# Patient Record
Sex: Female | Born: 1950 | Race: White | Hispanic: No | Marital: Married | Smoking: Never smoker
Health system: Southern US, Community
[De-identification: ages and names within clinical notes are randomized; demographics above are authoritative.]

## PROBLEM LIST (undated history)

## (undated) DIAGNOSIS — T753XXA Motion sickness, initial encounter: Secondary | ICD-10-CM

## (undated) HISTORY — PX: BREAST EXCISIONAL BIOPSY: SUR124

## (undated) HISTORY — PX: BREAST BIOPSY: SHX20

---

## 1997-10-20 ENCOUNTER — Other Ambulatory Visit: Admission: RE | Admit: 1997-10-20 | Discharge: 1997-10-20 | Payer: Self-pay | Admitting: Gynecology

## 1998-10-24 ENCOUNTER — Other Ambulatory Visit: Admission: RE | Admit: 1998-10-24 | Discharge: 1998-10-24 | Payer: Self-pay | Admitting: Gynecology

## 1999-11-15 ENCOUNTER — Other Ambulatory Visit: Admission: RE | Admit: 1999-11-15 | Discharge: 1999-11-15 | Payer: Self-pay | Admitting: Gynecology

## 1999-12-06 ENCOUNTER — Other Ambulatory Visit: Admission: RE | Admit: 1999-12-06 | Discharge: 1999-12-06 | Payer: Self-pay | Admitting: Radiology

## 2000-11-25 ENCOUNTER — Other Ambulatory Visit: Admission: RE | Admit: 2000-11-25 | Discharge: 2000-11-25 | Payer: Self-pay | Admitting: Gynecology

## 2000-11-28 ENCOUNTER — Other Ambulatory Visit: Admission: RE | Admit: 2000-11-28 | Discharge: 2000-11-28 | Payer: Self-pay | Admitting: Gynecology

## 2001-12-01 ENCOUNTER — Other Ambulatory Visit: Admission: RE | Admit: 2001-12-01 | Discharge: 2001-12-01 | Payer: Self-pay | Admitting: Gynecology

## 2004-11-23 ENCOUNTER — Ambulatory Visit: Payer: Self-pay | Admitting: General Surgery

## 2005-12-06 ENCOUNTER — Ambulatory Visit: Payer: Self-pay | Admitting: General Surgery

## 2007-01-21 ENCOUNTER — Ambulatory Visit: Payer: Self-pay | Admitting: General Surgery

## 2008-01-22 ENCOUNTER — Ambulatory Visit: Payer: Self-pay | Admitting: General Surgery

## 2009-01-24 ENCOUNTER — Ambulatory Visit: Payer: Self-pay | Admitting: General Surgery

## 2011-11-28 ENCOUNTER — Ambulatory Visit: Payer: Self-pay | Admitting: Internal Medicine

## 2014-07-29 ENCOUNTER — Ambulatory Visit: Payer: Self-pay | Admitting: Internal Medicine

## 2014-08-02 ENCOUNTER — Ambulatory Visit: Payer: Self-pay | Admitting: Internal Medicine

## 2014-08-04 ENCOUNTER — Ambulatory Visit: Payer: Self-pay | Admitting: Internal Medicine

## 2014-08-18 ENCOUNTER — Ambulatory Visit: Payer: Self-pay | Admitting: Surgery

## 2014-08-26 ENCOUNTER — Ambulatory Visit: Payer: Self-pay | Admitting: Surgery

## 2014-08-26 HISTORY — PX: BREAST EXCISIONAL BIOPSY: SUR124

## 2014-10-11 LAB — SURGICAL PATHOLOGY

## 2014-10-17 NOTE — Op Note (Signed)
PATIENT NAME:  Sheila FalconerBOSWELL, [name] MR#:  [id number] DATE OF BIRTH:  May 19, 1951  DATE OF PROCEDURE:  08/26/2014  PREOPERATIVE DIAGNOSIS: Right breast mass.   POSTOPERATIVE DIAGNOSIS: Right breast mass.   PROCEDURE: Excision right breast mass.   SURGEON: Adella HareJ. Wilton Keon Benscoter, M.D.   ANESTHESIA: General.   INDICATIONS: This 64 year old female recently had screening mammograms demonstrating a density in the lower inner quadrant of the right breast. Ultrasound demonstrated a 10 x 5 x 5 mm oval nodule at the 5 o'clock position 4 cm from the nipple. Ultrasound-guided biopsy demonstrated complex intraductal proliferative and sclerotic papillary lesion with atypia. Preop x-ray needle localization was recommended and also recommended excision of the mass for further evaluation and treatment.   DESCRIPTION OF PROCEDURE: The patient was placed on the operating table in the supine position under general anesthesia. The dressing was removed from the inferior medial aspect of the right breast exposing the Kopans wire, which entered the breast at approximately 5 o'clock position.  The wire was cut 2 cm from the skin. The breast was prepared with ChloraPrep,  draped in a sterile manner.   A curvilinear incision was made, which was about 1 cm outside of the border of the areola, and carried down through subcutaneous tissues to encounter the wire. Next, a portion of tissue surrounding the thick part of the wire was excised. This portion of tissue was approximately 1.8 x 1.8 x 3 cm in dimension and was submitted for specimen mammogram and routine pathology. The wound was inspected and several small bleeding points were cauterized. The subcutaneous tissues were infiltrated with 0.5% Sensorcaine with epinephrine. Also, additional tissues surrounding cautery artifact was infiltrated as well. Hemostasis was intact. Subcutaneous tissues were closed with 4-0 chromic, and the skin was closed with running 4-0 Monocryl subcuticular  suture and LiquiBand. The patient tolerated surgery satisfactorily and was then prepared for transfer to the recovery room.     ____________________________ Shela CommonsJ. Renda RollsWilton Kimbely Whiteaker, MD jws:tr D: 08/26/2014 11:17:25 ET T: 08/26/2014 11:31:11 ET JOB#: [id number]  cc: Adella HareJ. Wilton Bandon Sherwin, MD, <Dictator> Adella HareWILTON J Keng Jewel MD ELECTRONICALLY SIGNED 08/27/2014 12:24

## 2014-10-17 NOTE — Op Note (Signed)
PATIENT NAME:  [name], Sheila J MR#:  [id number] DATE OF BIRTH:  08/01/1950  DATE OF PROCEDURE:  08/26/2014  PREOPERATIVE DIAGNOSIS: Right breast mass.   POSTOPERATIVE DIAGNOSIS: Right breast mass.   PROCEDURE: Excision right breast mass.   SURGEON: J. Wilton Demarkus Remmel, M.D.   ANESTHESIA: General.   INDICATIONS: This 63-year-old female recently had screening mammograms demonstrating a density in the lower inner quadrant of the right breast. Ultrasound demonstrated a 10 x 5 x 5 mm oval nodule at the 5 o'clock position 4 cm from the nipple. Ultrasound-guided biopsy demonstrated complex intraductal proliferative and sclerotic papillary lesion with atypia. Preop x-ray needle localization was recommended and also recommended excision of the mass for further evaluation and treatment.   DESCRIPTION OF PROCEDURE: The patient was placed on the operating table in the supine position under general anesthesia. The dressing was removed from the inferior medial aspect of the right breast exposing the Kopans wire, which entered the breast at approximately 5 o'clock position.  The wire was cut 2 cm from the skin. The breast was prepared with ChloraPrep,  draped in a sterile manner.   A curvilinear incision was made, which was about 1 cm outside of the border of the areola, and carried down through subcutaneous tissues to encounter the wire. Next, a portion of tissue surrounding the thick part of the wire was excised. This portion of tissue was approximately 1.8 x 1.8 x 3 cm in dimension and was submitted for specimen mammogram and routine pathology. The wound was inspected and several small bleeding points were cauterized. The subcutaneous tissues were infiltrated with 0.5% Sensorcaine with epinephrine. Also, additional tissues surrounding cautery artifact was infiltrated as well. Hemostasis was intact. Subcutaneous tissues were closed with 4-0 chromic, and the skin was closed with running 4-0 Monocryl subcuticular  suture and LiquiBand. The patient tolerated surgery satisfactorily and was then prepared for transfer to the recovery room.     ____________________________ J. Wilton Jalasia Eskridge, MD jws:tr D: 08/26/2014 11:17:25 ET T: 08/26/2014 11:31:11 ET JOB#: [id number]  cc: J. Wilton Azalea Cedar, MD, <Dictator> WILTON J Icess Bertoni MD ELECTRONICALLY SIGNED 08/27/2014 12:24 

## 2015-07-18 IMAGING — US US BREAST*R* LIMITED INC AXILLA
1 series · 5 of 5 positions shown · non-contrast
Comparison: Priors

CLINICAL DATA: Patient recalled from screening for right breast
mass.

EXAM:
DIGITAL DIAGNOSTIC RIGHT MAMMOGRAM
ULTRASOUND RIGHT BREAST

[Series 1: us breast*right* limited inc axilla · 0.10mm/px · 5 of 5 slices shown]
[im 1/5]
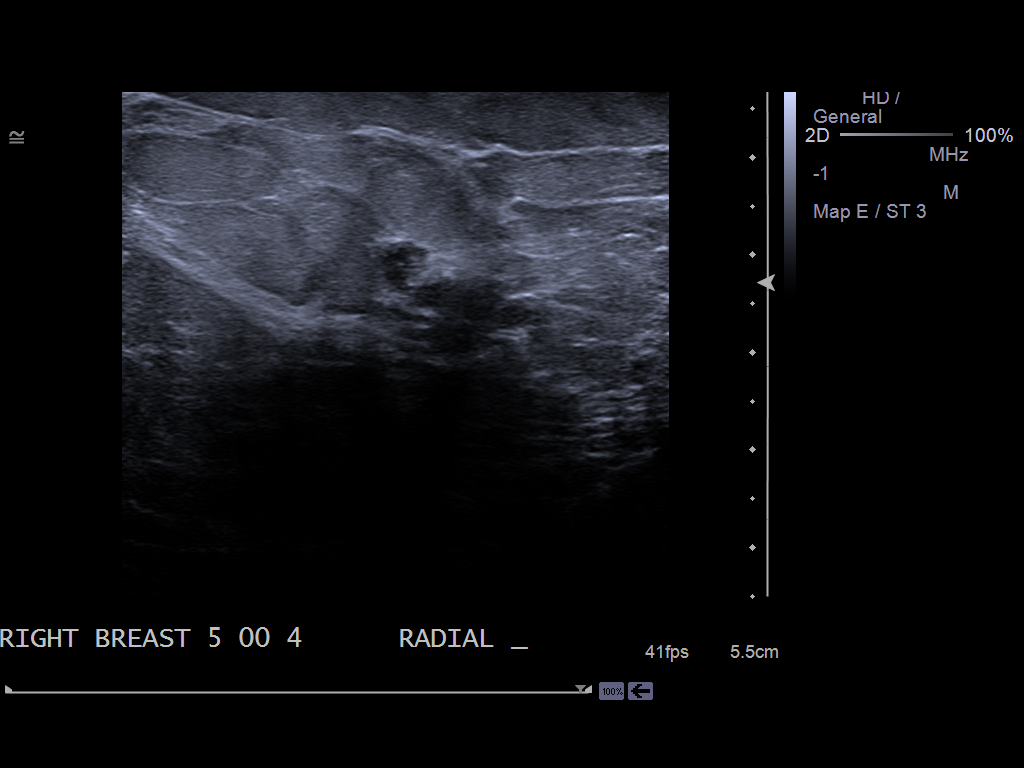
[im 2/5]
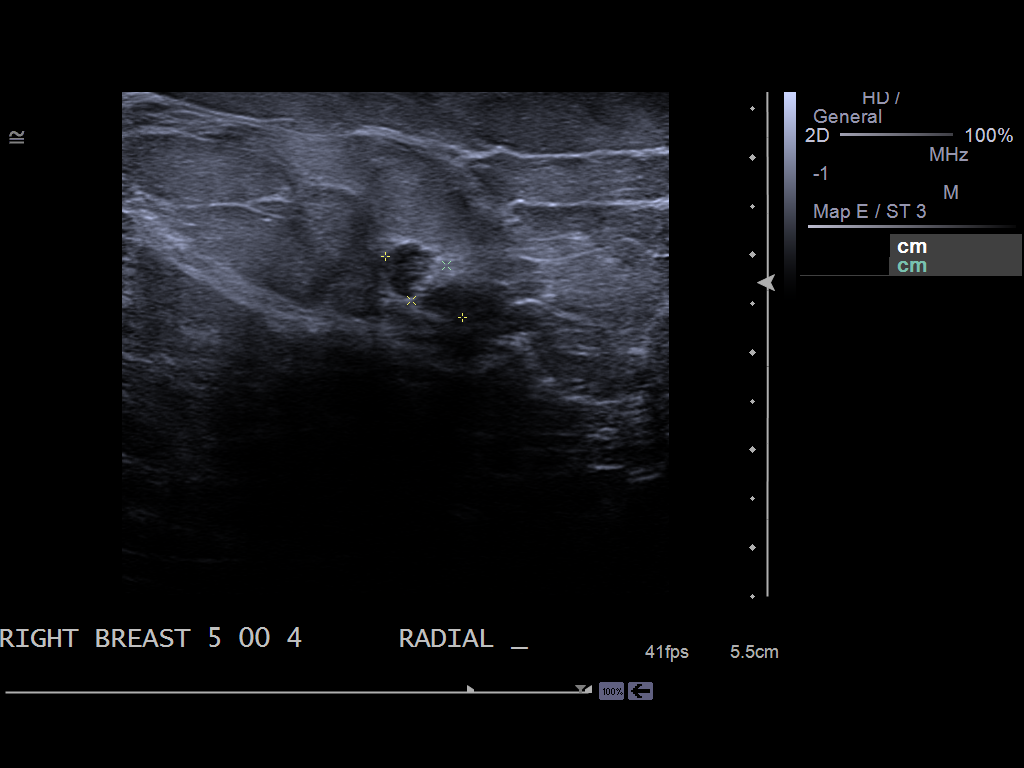
[im 3/5]
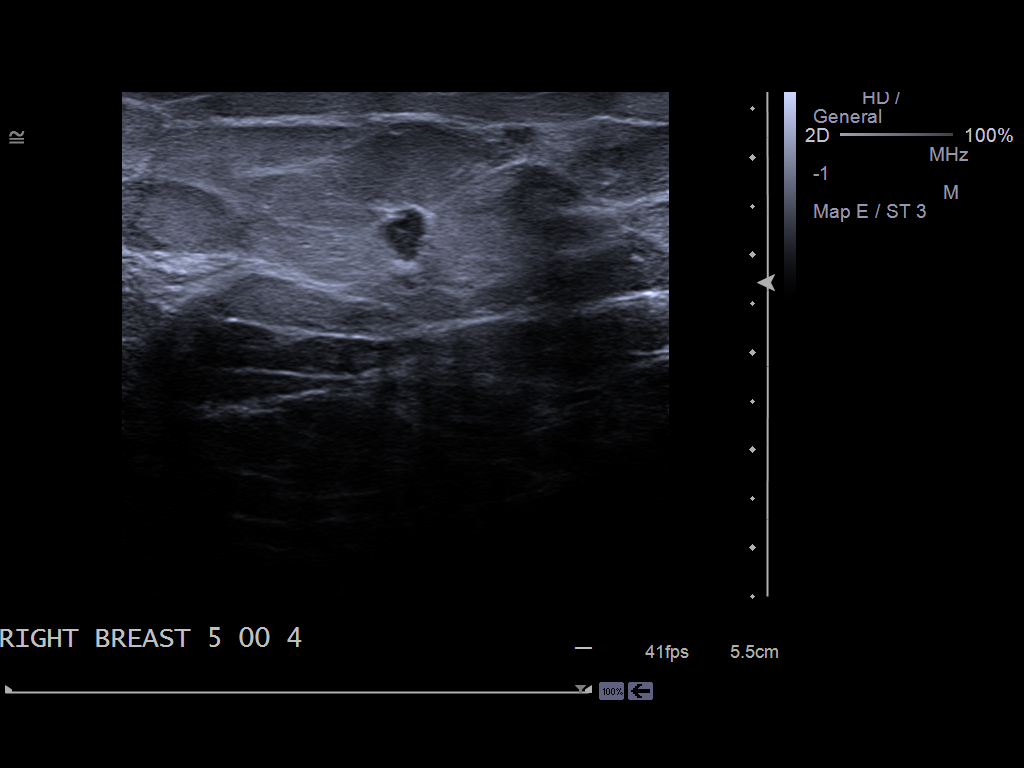
[im 4/5]
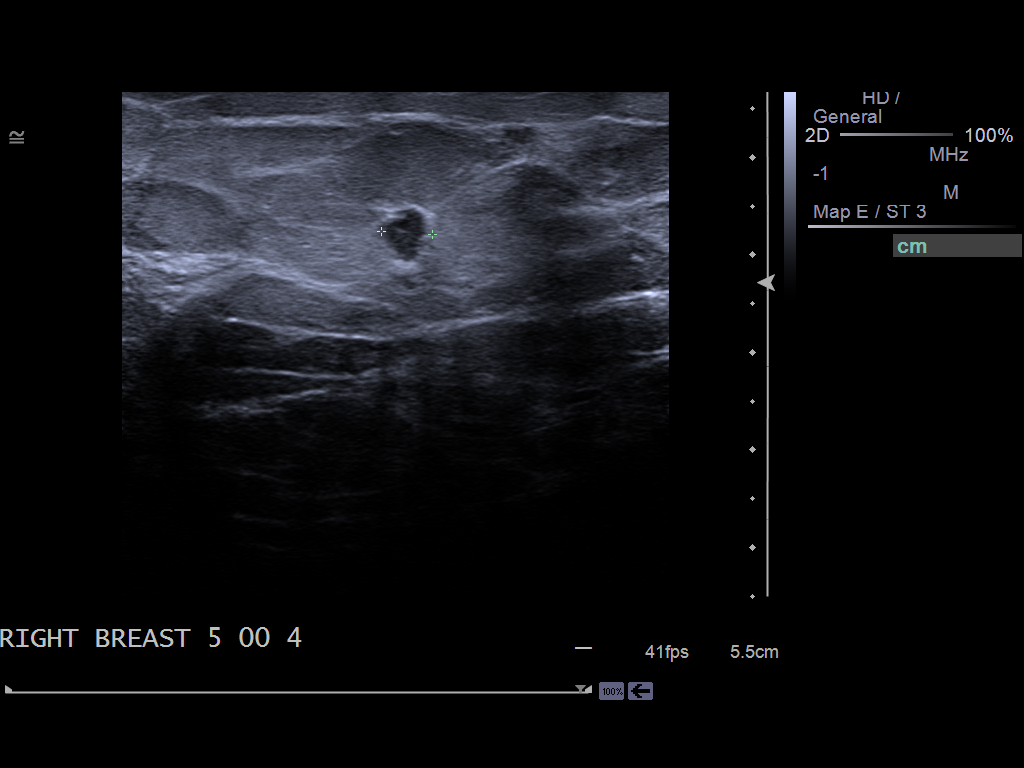
[im 5/5]
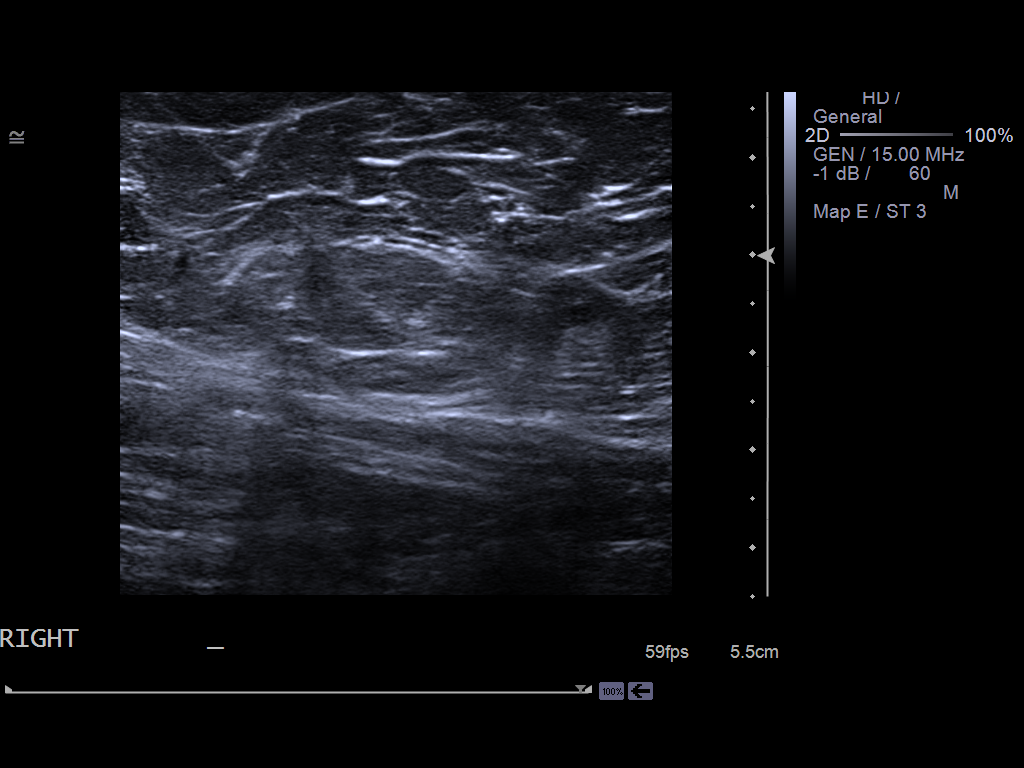

[5 of 5 positions shown; findings below may reference images not displayed]

ACR Breast Density Category b: There are scattered areas of
fibroglandular density.
FINDINGS: Spot compression CC and MLO views demonstrate a new 10 mm oval mass
within the lower inner right breast middle depth.

On physical exam, I palpate no discrete mass within the lower inner
right breast.

Targeted ultrasound is performed, showing a 10 x 5 x 5 mm mildly
irregular hypoechoic mass within the right breast 5 o'clock position
4 cm from the nipple. No right axillary lymphadenopathy.
IMPRESSION: Indeterminate right breast mass.

RECOMMENDATION:
Ultrasound-guided core needle biopsy.

This will be scheduled at the patient's convenience.

I have discussed the findings and recommendations with the patient.
Results were also provided in writing at the conclusion of the
visit. If applicable, a reminder letter will be sent to the patient
regarding the next appointment.

BI-RADS CATEGORY  4: Suspicious.

## 2016-09-21 ENCOUNTER — Other Ambulatory Visit: Payer: Self-pay | Admitting: Internal Medicine

## 2016-09-21 DIAGNOSIS — Z1231 Encounter for screening mammogram for malignant neoplasm of breast: Secondary | ICD-10-CM

## 2016-09-26 ENCOUNTER — Ambulatory Visit
Admission: RE | Admit: 2016-09-26 | Discharge: 2016-09-26 | Disposition: A | Discharge status: Home / Self Care | Payer: Medicare Other | Source: Ambulatory Visit | Attending: Internal Medicine | Admitting: Internal Medicine

## 2016-09-26 ENCOUNTER — Encounter: Payer: Self-pay | Admitting: Radiology

## 2016-09-26 DIAGNOSIS — R928 Other abnormal and inconclusive findings on diagnostic imaging of breast: Secondary | ICD-10-CM | POA: Diagnosis not present

## 2016-09-26 DIAGNOSIS — Z1231 Encounter for screening mammogram for malignant neoplasm of breast: Secondary | ICD-10-CM

## 2016-10-01 ENCOUNTER — Other Ambulatory Visit: Payer: Self-pay | Admitting: Internal Medicine

## 2016-10-01 DIAGNOSIS — R928 Other abnormal and inconclusive findings on diagnostic imaging of breast: Secondary | ICD-10-CM

## 2016-10-11 ENCOUNTER — Ambulatory Visit
Admission: RE | Admit: 2016-10-11 | Discharge: 2016-10-11 | Disposition: A | Discharge status: Home / Self Care | Payer: Medicare Other | Source: Ambulatory Visit | Attending: Internal Medicine | Admitting: Internal Medicine

## 2016-10-11 DIAGNOSIS — R928 Other abnormal and inconclusive findings on diagnostic imaging of breast: Secondary | ICD-10-CM

## 2016-10-11 DIAGNOSIS — N6489 Other specified disorders of breast: Secondary | ICD-10-CM | POA: Insufficient documentation

## 2016-10-12 ENCOUNTER — Other Ambulatory Visit: Payer: Self-pay | Admitting: Internal Medicine

## 2016-10-12 DIAGNOSIS — R928 Other abnormal and inconclusive findings on diagnostic imaging of breast: Secondary | ICD-10-CM

## 2016-10-18 ENCOUNTER — Ambulatory Visit
Admission: RE | Admit: 2016-10-18 | Discharge: 2016-10-18 | Disposition: A | Discharge status: Home / Self Care | Payer: Medicare Other | Source: Ambulatory Visit | Attending: Internal Medicine | Admitting: Internal Medicine

## 2016-10-18 DIAGNOSIS — R928 Other abnormal and inconclusive findings on diagnostic imaging of breast: Secondary | ICD-10-CM

## 2016-10-18 DIAGNOSIS — R921 Mammographic calcification found on diagnostic imaging of breast: Secondary | ICD-10-CM | POA: Diagnosis not present

## 2016-10-18 DIAGNOSIS — N6489 Other specified disorders of breast: Secondary | ICD-10-CM | POA: Diagnosis present

## 2016-10-18 HISTORY — PX: BREAST BIOPSY: SHX20

## 2016-10-19 LAB — SURGICAL PATHOLOGY

## 2018-01-28 ENCOUNTER — Encounter: Payer: Self-pay | Admitting: *Deleted

## 2018-01-28 ENCOUNTER — Other Ambulatory Visit: Payer: Self-pay

## 2018-01-29 ENCOUNTER — Encounter: Payer: Self-pay | Admitting: Anesthesiology

## 2018-01-31 ENCOUNTER — Ambulatory Visit
Admission: RE | Admit: 2018-01-31 | Discharge: 2018-01-31 | Disposition: A | Discharge status: Home / Self Care | Payer: Medicare Other | Source: Ambulatory Visit | Attending: Unknown Physician Specialty | Admitting: Unknown Physician Specialty

## 2018-01-31 ENCOUNTER — Encounter
Admission: RE | Disposition: A | Discharge status: Home / Self Care | Payer: Self-pay | Source: Ambulatory Visit | Attending: Unknown Physician Specialty

## 2018-01-31 DIAGNOSIS — L720 Epidermal cyst: Secondary | ICD-10-CM | POA: Diagnosis not present

## 2018-01-31 DIAGNOSIS — R221 Localized swelling, mass and lump, neck: Secondary | ICD-10-CM | POA: Diagnosis present

## 2018-01-31 HISTORY — PX: EXCISION MASS NECK: SHX6703

## 2018-01-31 HISTORY — DX: Motion sickness, initial encounter: T75.3XXA

## 2018-01-31 SURGERY — EXCISION, MASS, NECK
Anesthesia: LOCAL | Site: Neck | Laterality: Left | Wound class: "Clean "

## 2018-01-31 MED ORDER — LIDOCAINE-EPINEPHRINE 1 %-1:100000 IJ SOLN
INTRAMUSCULAR | Status: DC | PRN
  Administered 2018-01-31: 3 mL via INTRADERMAL

## 2018-01-31 MED ORDER — BACITRACIN 500 UNIT/GM EX OINT
TOPICAL_OINTMENT | CUTANEOUS | Status: DC | PRN
  Administered 2018-01-31: 1 via TOPICAL

## 2018-01-31 SURGICAL SUPPLY — 29 items
"PENCIL ELECTRO HAND CTR " (MISCELLANEOUS) ×1 IMPLANT
BLADE SURG 15 STRL LF DISP TIS (BLADE) ×1 IMPLANT
BLADE SURG 15 STRL SS (BLADE) ×1
BNDG ADH 2 X3.75 FABRIC TAN LF (GAUZE/BANDAGES/DRESSINGS) ×1 IMPLANT
CNTNR SPEC C3OZ STD GRAD LEK (MISCELLANEOUS) ×1 IMPLANT
CONT SPEC 3OZ W/LID STRL (MISCELLANEOUS) ×1
CORD BIP STRL DISP 12FT (MISCELLANEOUS) ×1 IMPLANT
DRAPE HEAD BAR (DRAPES) ×2 IMPLANT
ELECT CAUTERY BLADE TIP 2.5 (TIP) ×2
ELECT REM PT RETURN 9FT ADLT (ELECTROSURGICAL) ×2
ELECTRODE CAUTERY BLDE TIP 2.5 (TIP) ×1 IMPLANT
ELECTRODE REM PT RTRN 9FT ADLT (ELECTROSURGICAL) ×1 IMPLANT
GLOVE BIO SURGEON STRL SZ7.5 (GLOVE) ×3 IMPLANT
GOWN STRL REUS W/ TWL LRG LVL3 (GOWN DISPOSABLE) ×2 IMPLANT
GOWN STRL REUS W/TWL LRG LVL3 (GOWN DISPOSABLE) ×2
KIT TURNOVER KIT A (KITS) ×2 IMPLANT
NDL HYPO 25GX1X1/2 BEV (NEEDLE) ×1 IMPLANT
NEEDLE HYPO 25GX1X1/2 BEV (NEEDLE) ×2 IMPLANT
NS IRRIG 500ML POUR BTL (IV SOLUTION) ×2 IMPLANT
PACK DRAPE NASAL/ENT (PACKS) ×2 IMPLANT
PENCIL ELECTRO HAND CTR (MISCELLANEOUS) ×2 IMPLANT
SOL PREP PVP 2OZ (MISCELLANEOUS) ×2
SOLUTION PREP PVP 2OZ (MISCELLANEOUS) ×1 IMPLANT
SPONGE KITTNER 5P (MISCELLANEOUS) ×2 IMPLANT
SUT PROLENE 4 0 PS 2 18 (SUTURE) ×2 IMPLANT
SUT VIC AB 4-0 RB1 27 (SUTURE) ×1
SUT VIC AB 4-0 RB1 27X BRD (SUTURE) IMPLANT
SYR 10ML LL (SYRINGE) ×2 IMPLANT
TOWEL OR 17X26 4PK STRL BLUE (TOWEL DISPOSABLE) ×2 IMPLANT

## 2018-01-31 NOTE — H&P (Signed)
The patient's history has been reviewed, patient examined, no change in status, stable for surgery.  Questions were answered to the patients satisfaction.  

## 2018-01-31 NOTE — Op Note (Signed)
01/31/2018  10:29 AM    Elio ForgetBoswell, Sheila  [id number]   Pre-Op Dx: NECK MASS  Post-op Dx: SAME  Proc: Excision of left anterior neck mass  Surg:  Davina Pokehapman T Katieann Hungate  Anes:  GOT  EBL: Less than 5 cc  Comp: None  Findings: 2 x 1.5 centimeter superficial neck mass  Procedure: [name] was identified holding area take the operating room placed in supine position.  A left anterior neck mass was easily identifiable and palpable on the left neck.  Incision line was marked using a natural skin crease.  Local anesthetic of 1% lidocaine with 1 100,000 units epinephrine was used to inject around the cystic mass a total of 3 cc was used.  The neck was then prepped and draped in sterile fashion.  A 15 blade was used to make an elliptical incision around the skin paddle overlying the cystic mass.  This was dissected into the subcutaneous fat.  The skin paddle and the underlying cystic mass were excised in entirety using short sharp scissors and the microbipolar.  The mass was thus removed.  Hemostasis was achieved using bipolar cautery.  The subcutaneous tissues were then closed using 4-0 Vicryl the skin was closed using a 4-0 Prolene.  Patient was then taken recovery room in stable condition.  Once on the back table the mass was opened using a 15 blade was consistent with an epidermal inclusion cyst.  Specimen: Left cystic neck mass measuring approximately 2 x 1.5 cm  Dispo:   Good  Plan: Discharged home routine wound care instructions follow-up 10 days  Davina PokeChapman T Angelissa Supan  01/31/2018 10:29 AM

## 2018-01-31 NOTE — OR Nursing (Signed)
0955:  BP:  160/89 RR: 18 SPO2:  100 HR:  73  1000:  BP: 145/88 RR:  18 SPO2:  100 HR:  81  1005:  BP: 144/87 RR :20 SPO2: 100 HR:  86  1010:  BP: 143/90 RR : 20 SPO2: 100 HR  83  1015  BP: 141/85 RR: 18 SPO2: 99 HR: 79  1020:  BP: 136/85 RR: 18 SPO2: 99 HR:78  1025:  BP: 140/96 RR:   18 SPO2:  99 HR:  78

## 2018-02-03 ENCOUNTER — Encounter: Payer: Self-pay | Admitting: Unknown Physician Specialty

## 2018-02-04 LAB — SURGICAL PATHOLOGY

## 2018-03-12 IMAGING — US US BREAST*R* LIMITED INC AXILLA
1 series · 13 of 25 positions shown · non-contrast
Comparison: Previous exam(s).

CLINICAL DATA: 65-year-old female presenting for screening recall
of multiple areas of distortion in the bilateral breasts. The
patient does have history of 2 excisional biopsies in the right
breast, and a stereotactic biopsy in the left breast.

EXAM:
2D DIGITAL DIAGNOSTIC BILATERAL MAMMOGRAM WITH CAD AND ADJUNCT TOMO
BILATERAL BREAST ULTRASOUND

[Series 1: us breast*right* limited inc axilla · 0.06mm/px · 13 of 28 slices shown]
[im 1/28]
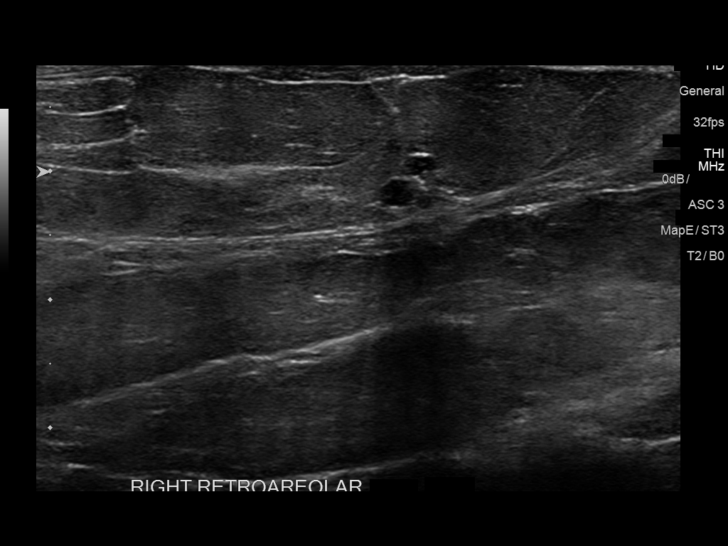
[im 3/28]
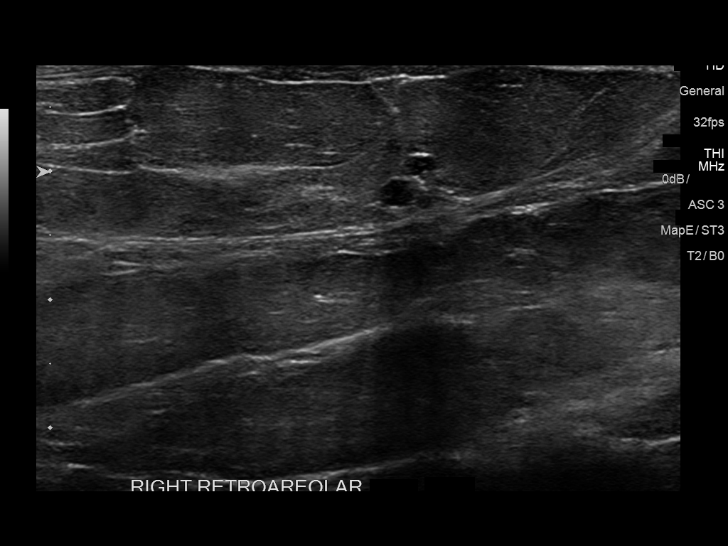
[im 5/28]
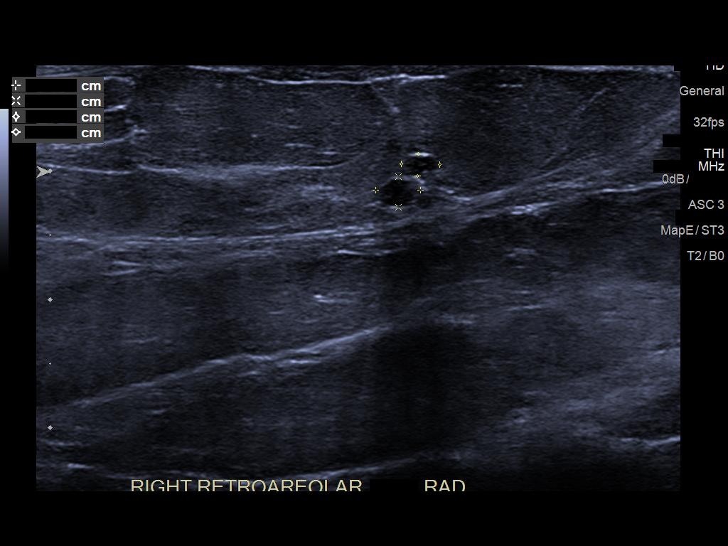
[im 7/28]
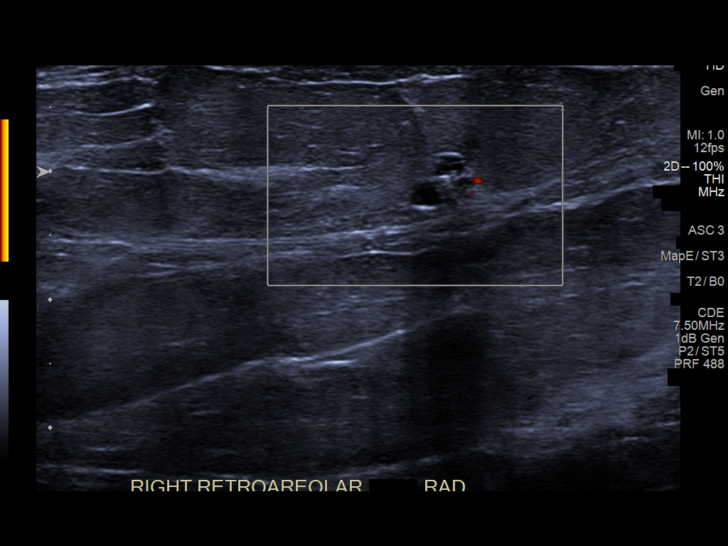
[im 10/28]
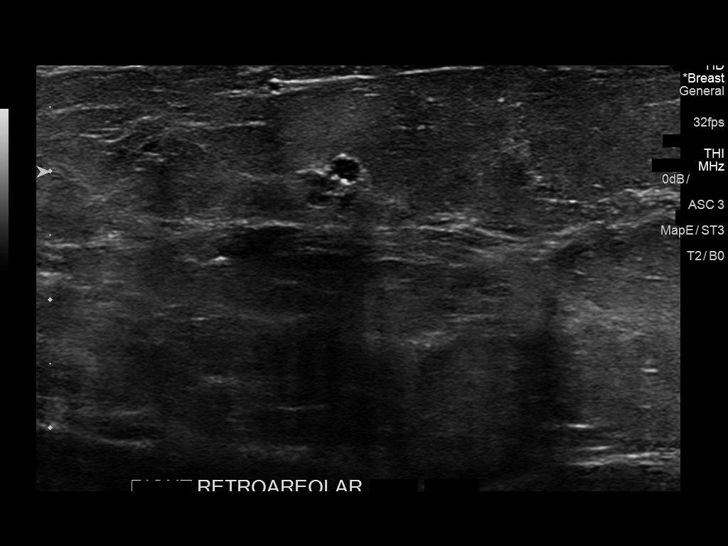
[im 12/28]
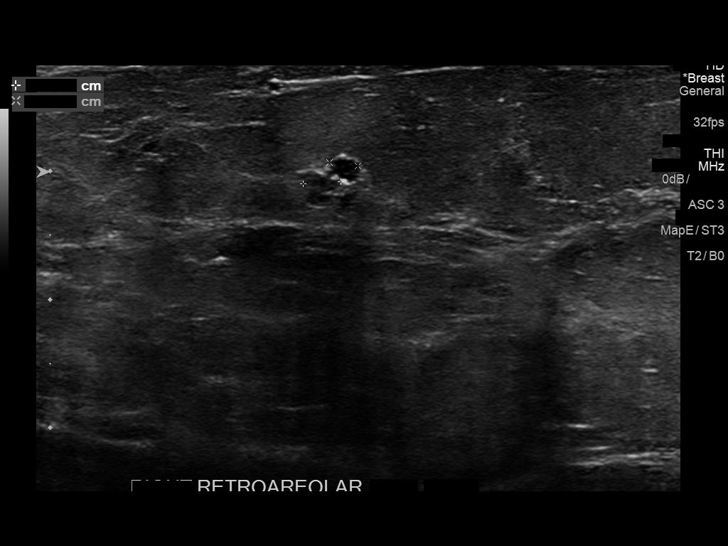
[im 14/28]
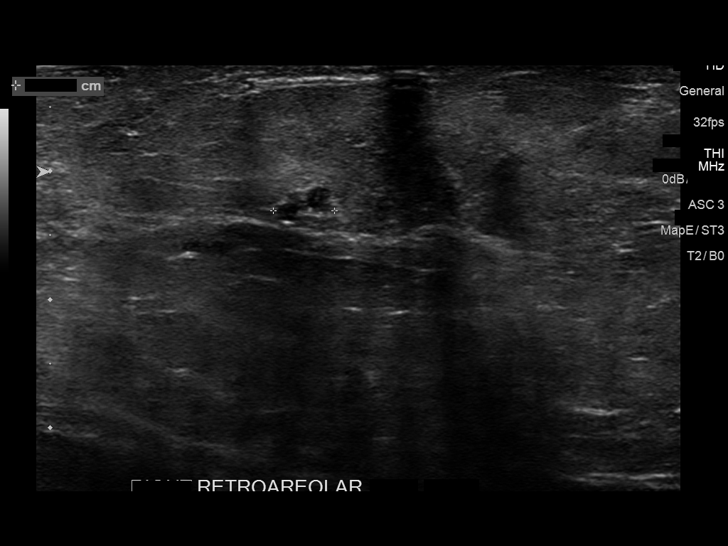
[im 16/28]
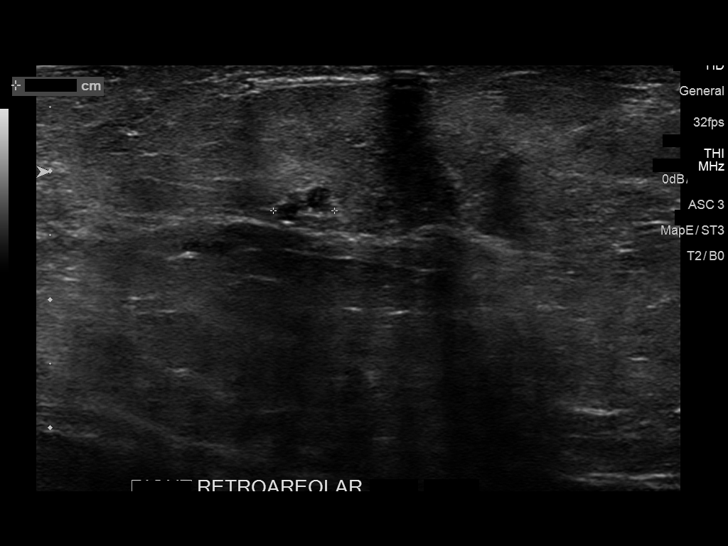
[im 19/28]
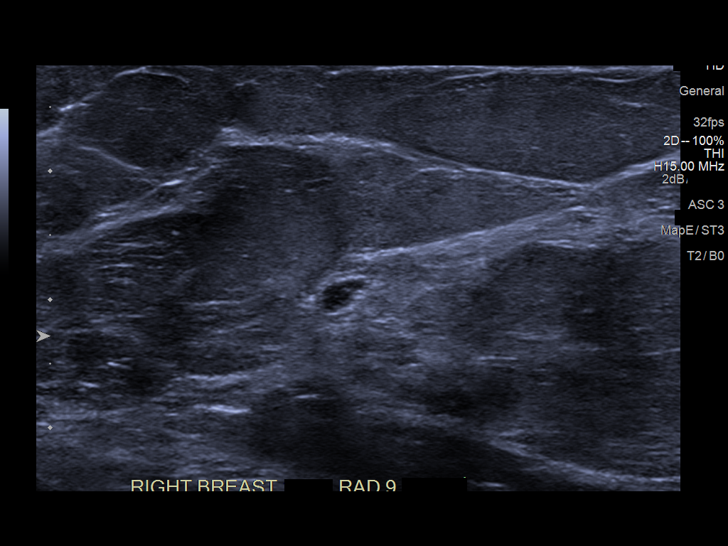
[im 21/28]
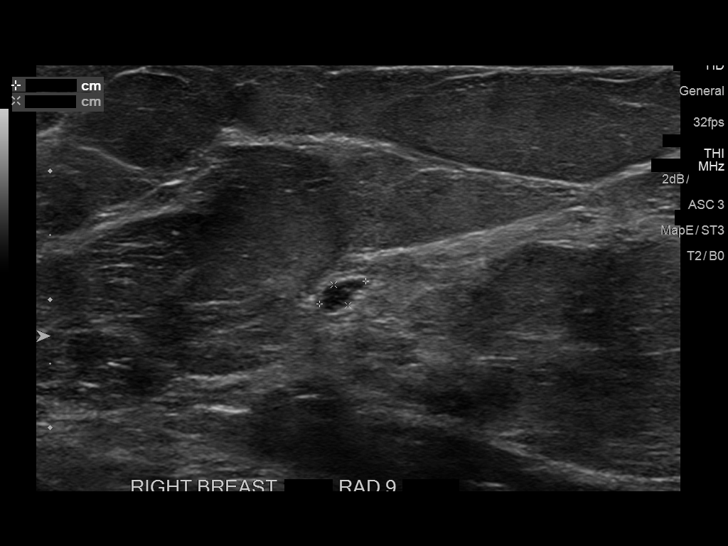
[im 23/28]
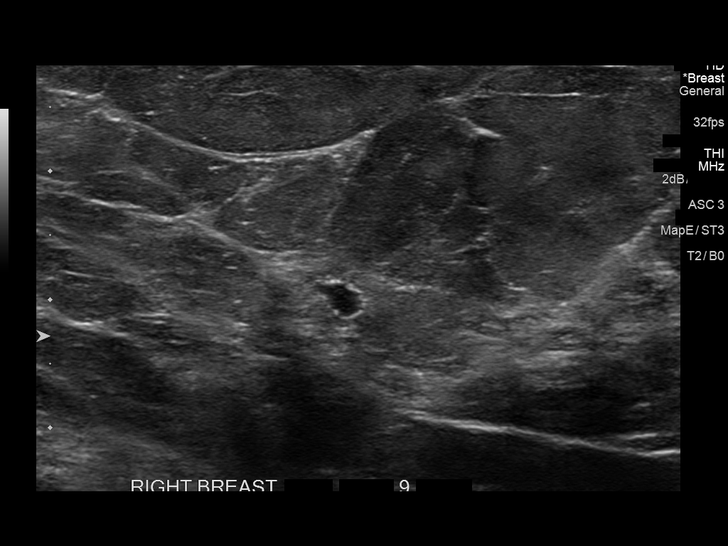
[im 25/28]
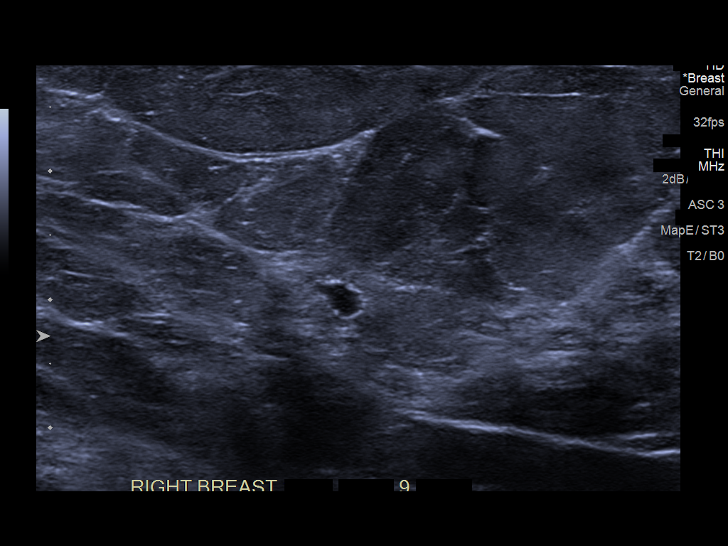
[im 28/28]
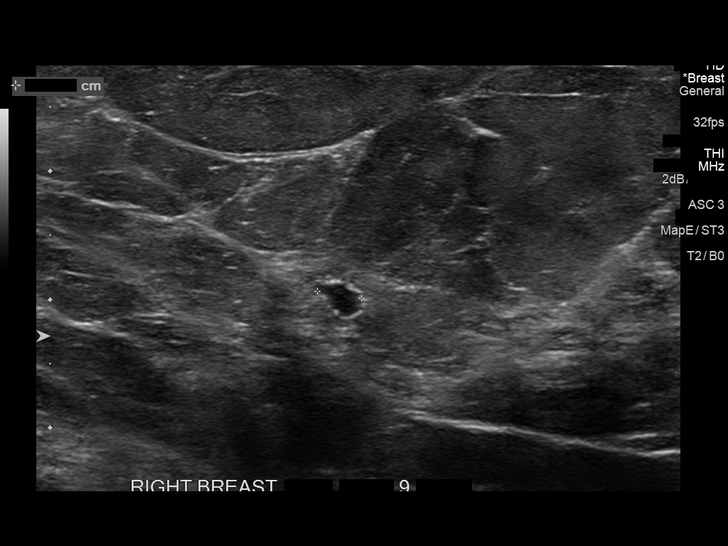

[13 of 25 positions shown; findings below may reference images not displayed]

ACR Breast Density Category b: There are scattered areas of
fibroglandular density.
FINDINGS: There are 3 adjacent areas of distortion in the superior and upper
outer quadrant of the right breast, which altogether span
approximately 8 cm of tissue. Scar markers have been placed on the
breast from prior sites of surgery, however the surgical scars are
in the periareolar and inferior right breast.

There is a similar appearance of more confluent areas of distortion
in the superior and upper outer quadrant of the left breast which
spans approximately 6 cm of tissue. There is a separate more subtle
site of distortion in the inferior slightly lower outer left breast
in the middle depth. The biopsy marking clip from a prior
stereotactic biopsy is identified separate from these areas of
distortion in the lateral aspect of the left breast, anterior depth.

Mammographic images were processed with CAD.

Ultrasound of the upper-outer quadrant of the right breast
demonstrates multiple small cysts. Several small cysts are also seen
in the upper-outer quadrant of the left breast. There are no
suspicious masses in the upper-outer quadrant of either breast to
correspond with the areas of distortion identified mammographically.
IMPRESSION: 1. There are multiple areas of distortion in the breasts
bilaterally, mostly in the upper-outer quadrants. There is no
sonographic correlate for the distortion.

RECOMMENDATION:
Stereotactic biopsy is recommended for 2 distant sites of distortion
in the bilateral breasts. If malignancy is identified on any of
these biopsies, MRI is recommended to evaluate the extent of
disease.

I have discussed the findings and recommendations with the patient.
Results were also provided in writing at the conclusion of the
visit. If applicable, a reminder letter will be sent to the patient
regarding the next appointment.

BI-RADS CATEGORY  4: Suspicious.

## 2018-03-12 IMAGING — US US BREAST*L* LIMITED INC AXILLA
1 series · 13 of 17 positions shown · non-contrast
Comparison: Previous exam(s).

CLINICAL DATA: 65-year-old female presenting for screening recall
of multiple areas of distortion in the bilateral breasts. The
patient does have history of 2 excisional biopsies in the right
breast, and a stereotactic biopsy in the left breast.

EXAM:
2D DIGITAL DIAGNOSTIC BILATERAL MAMMOGRAM WITH CAD AND ADJUNCT TOMO
BILATERAL BREAST ULTRASOUND

[Series 1: us breast*left* limited inc axilla · 0.09mm/px · 13 of 17 slices shown]
[im 1/17]
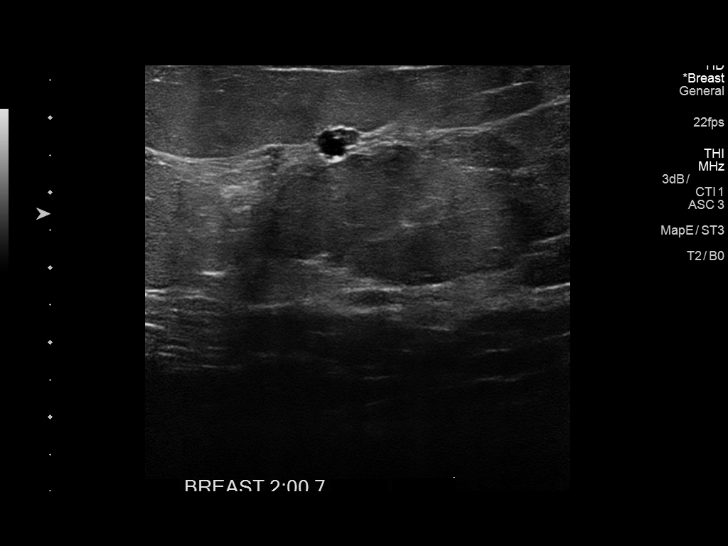
[im 2/17]
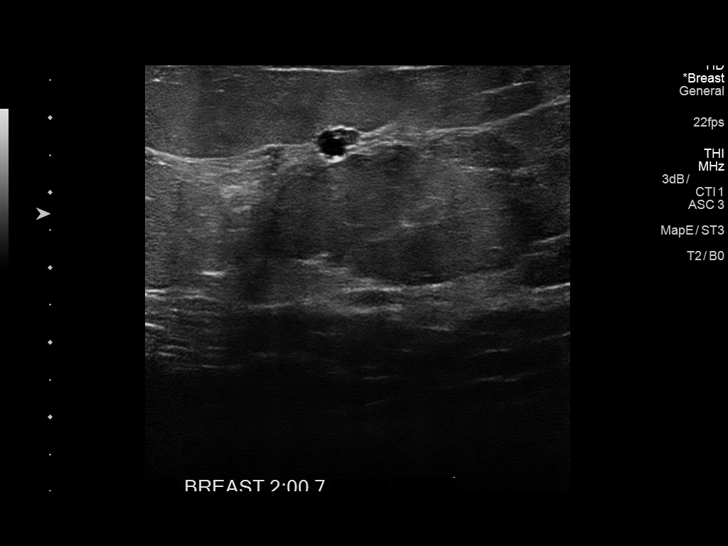
[im 4/17]
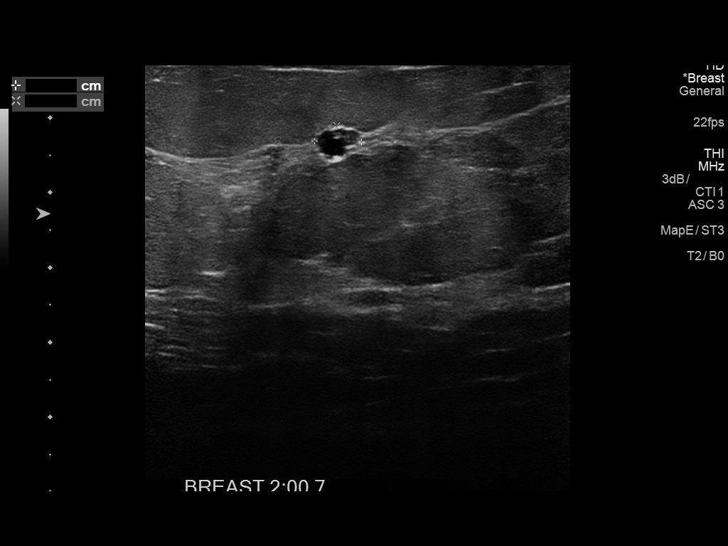
[im 5/17]
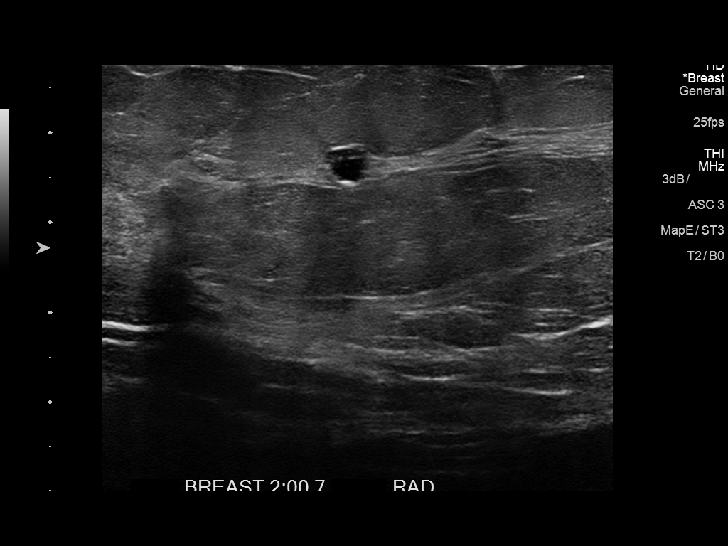
[im 6/17]
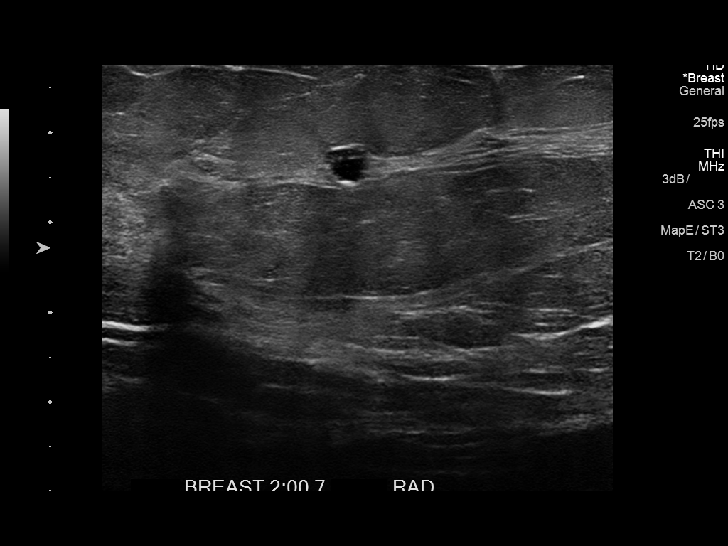
[im 8/17]
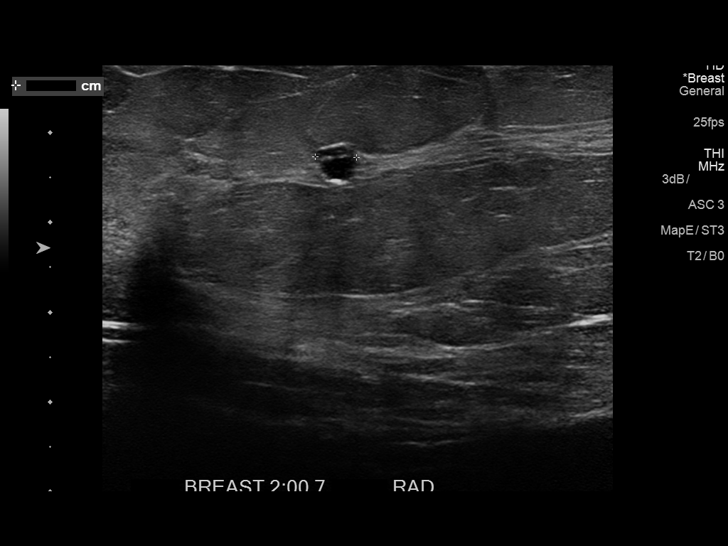
[im 9/17]
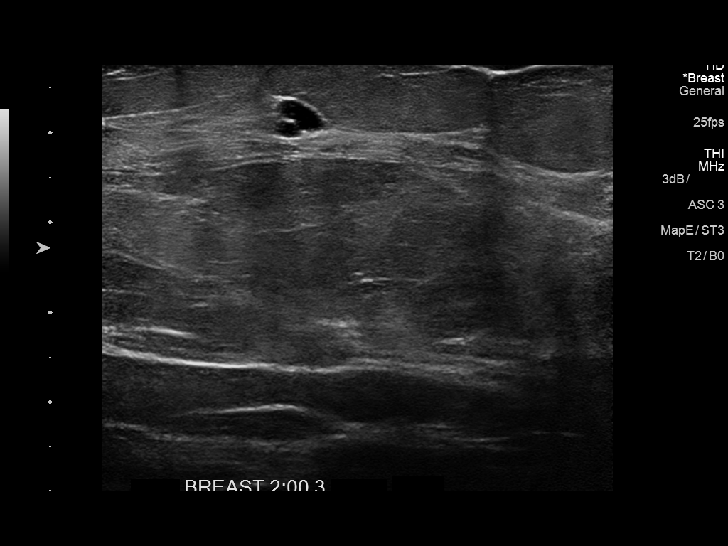
[im 10/17]
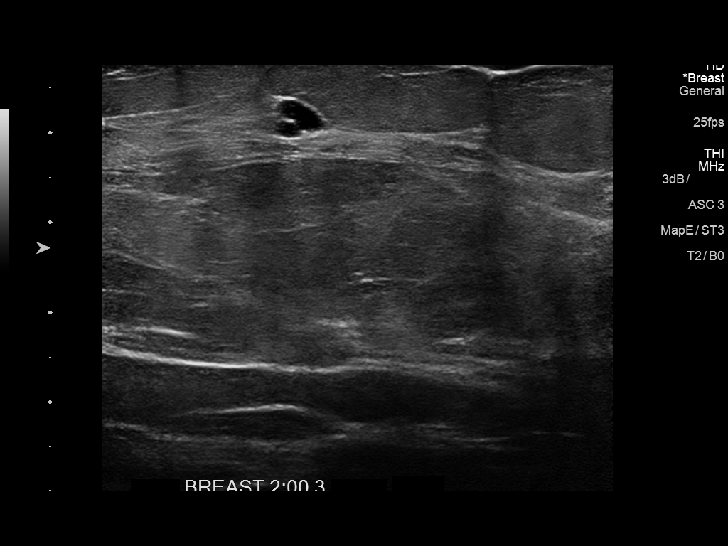
[im 12/17]
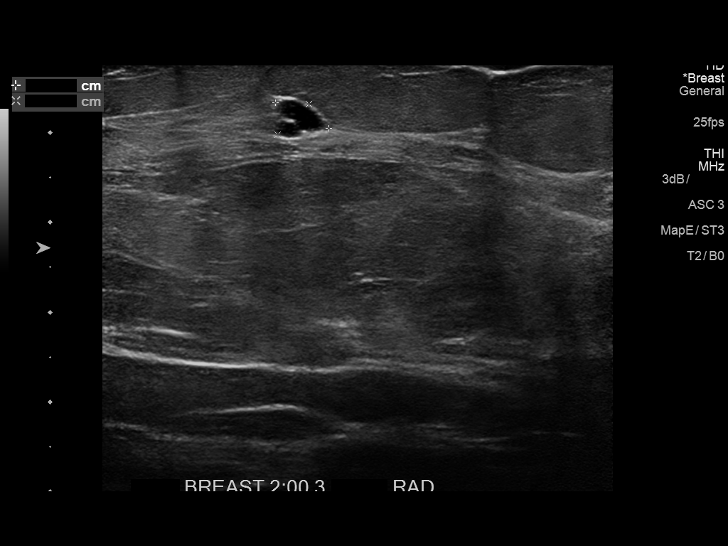
[im 13/17]
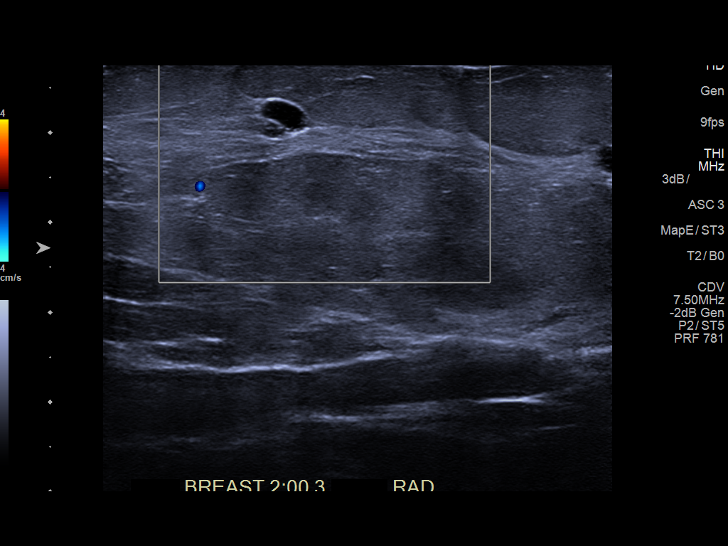
[im 14/17]
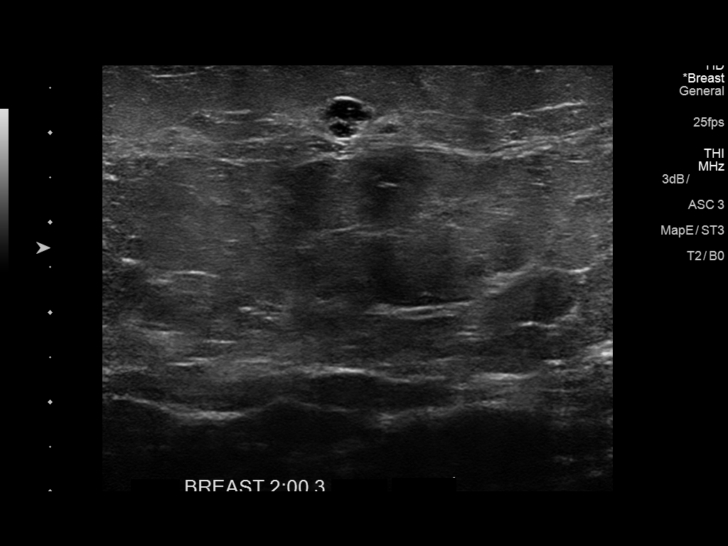
[im 16/17]
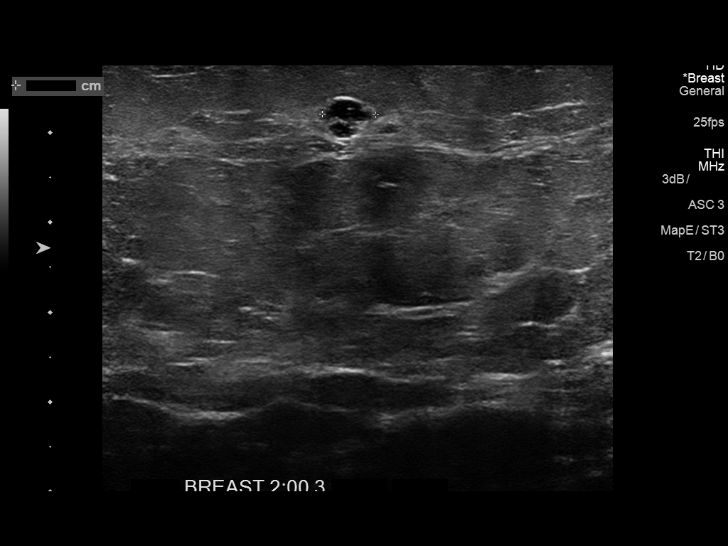
[im 17/17]
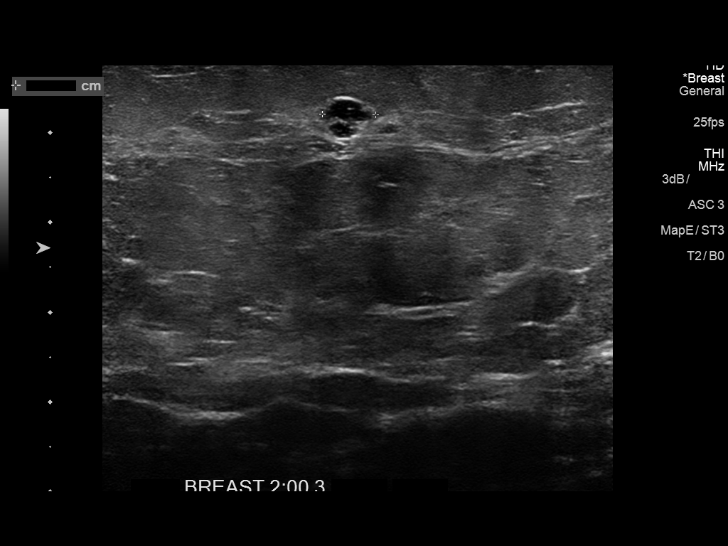

[13 of 17 positions shown; findings below may reference images not displayed]

ACR Breast Density Category b: There are scattered areas of
fibroglandular density.
FINDINGS: There are 3 adjacent areas of distortion in the superior and upper
outer quadrant of the right breast, which altogether span
approximately 8 cm of tissue. Scar markers have been placed on the
breast from prior sites of surgery, however the surgical scars are
in the periareolar and inferior right breast.

There is a similar appearance of more confluent areas of distortion
in the superior and upper outer quadrant of the left breast which
spans approximately 6 cm of tissue. There is a separate more subtle
site of distortion in the inferior slightly lower outer left breast
in the middle depth. The biopsy marking clip from a prior
stereotactic biopsy is identified separate from these areas of
distortion in the lateral aspect of the left breast, anterior depth.

Mammographic images were processed with CAD.

Ultrasound of the upper-outer quadrant of the right breast
demonstrates multiple small cysts. Several small cysts are also seen
in the upper-outer quadrant of the left breast. There are no
suspicious masses in the upper-outer quadrant of either breast to
correspond with the areas of distortion identified mammographically.
IMPRESSION: 1. There are multiple areas of distortion in the breasts
bilaterally, mostly in the upper-outer quadrants. There is no
sonographic correlate for the distortion.

RECOMMENDATION:
Stereotactic biopsy is recommended for 2 distant sites of distortion
in the bilateral breasts. If malignancy is identified on any of
these biopsies, MRI is recommended to evaluate the extent of
disease.

I have discussed the findings and recommendations with the patient.
Results were also provided in writing at the conclusion of the
visit. If applicable, a reminder letter will be sent to the patient
regarding the next appointment.

BI-RADS CATEGORY  4: Suspicious.

## 2020-06-21 ENCOUNTER — Other Ambulatory Visit: Payer: Self-pay | Admitting: Internal Medicine

## 2020-06-21 DIAGNOSIS — Z8249 Family history of ischemic heart disease and other diseases of the circulatory system: Secondary | ICD-10-CM

## 2020-06-27 ENCOUNTER — Other Ambulatory Visit: Payer: Self-pay | Admitting: Internal Medicine

## 2020-06-27 DIAGNOSIS — I10 Essential (primary) hypertension: Secondary | ICD-10-CM

## 2020-06-27 DIAGNOSIS — Z8249 Family history of ischemic heart disease and other diseases of the circulatory system: Secondary | ICD-10-CM

## 2020-06-28 ENCOUNTER — Ambulatory Visit
Admission: RE | Admit: 2020-06-28 | Discharge: 2020-06-28 | Disposition: A | Discharge status: Home / Self Care | Payer: Medicare PPO | Source: Ambulatory Visit | Attending: Internal Medicine | Admitting: Internal Medicine

## 2020-06-28 ENCOUNTER — Other Ambulatory Visit: Payer: Self-pay

## 2020-06-28 DIAGNOSIS — Z8249 Family history of ischemic heart disease and other diseases of the circulatory system: Secondary | ICD-10-CM | POA: Insufficient documentation

## 2020-06-28 DIAGNOSIS — Z136 Encounter for screening for cardiovascular disorders: Secondary | ICD-10-CM | POA: Insufficient documentation

## 2020-06-28 DIAGNOSIS — I10 Essential (primary) hypertension: Secondary | ICD-10-CM | POA: Insufficient documentation

## 2020-06-28 DIAGNOSIS — I700 Atherosclerosis of aorta: Secondary | ICD-10-CM | POA: Insufficient documentation

## 2021-06-13 IMAGING — US US ABDOMINAL AORTA SCREENING AAA
1 series · 14 of 23 positions shown · non-contrast
Comparison: None.

CLINICAL DATA: Patient between 65-75 years of age with family
history of AAA.

EXAM:
US ABDOMINAL AORTA MEDICARE SCREENING
TECHNIQUE: Ultrasound examination of the abdominal aorta was performed as a
screening evaluation for abdominal aortic aneurysm.

[Series 1: us aorta medicare screening · 14 of 23 slices shown]
[im 1/23]
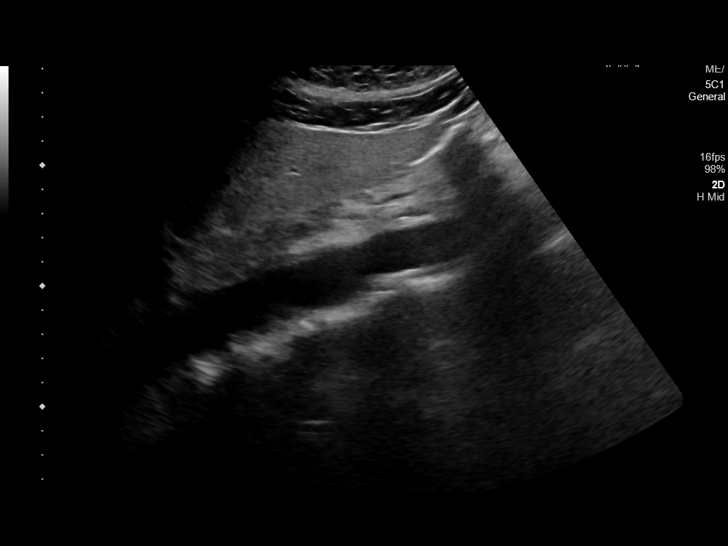
[im 3/23]
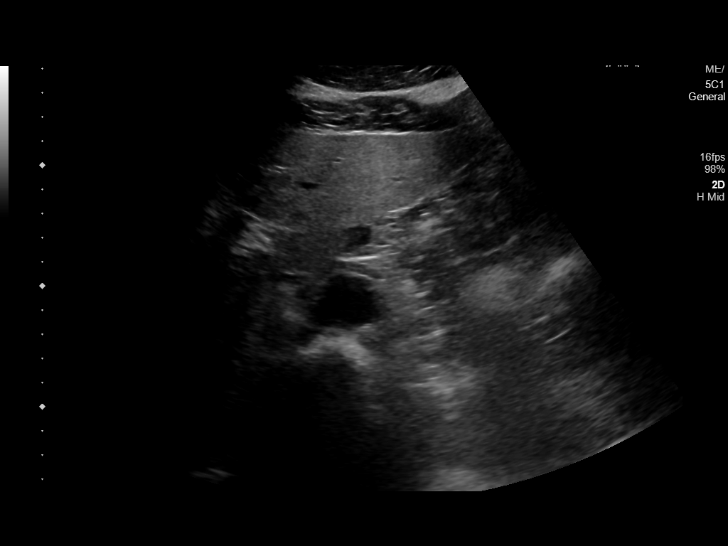
[im 5/23]
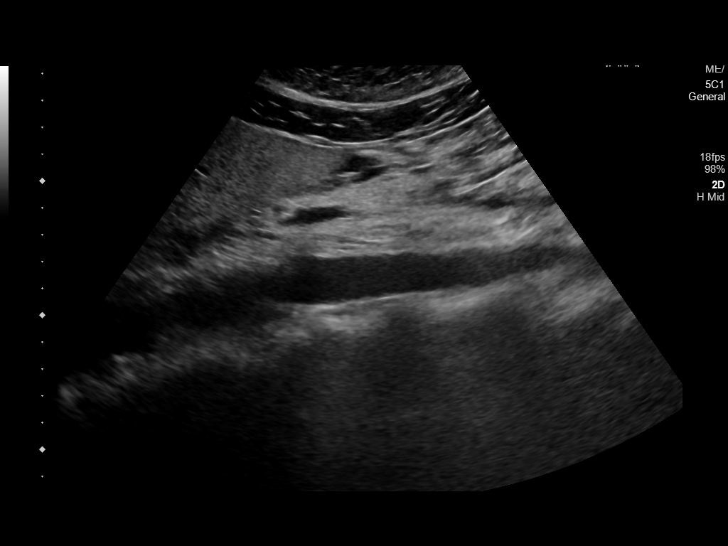
[im 6/23]
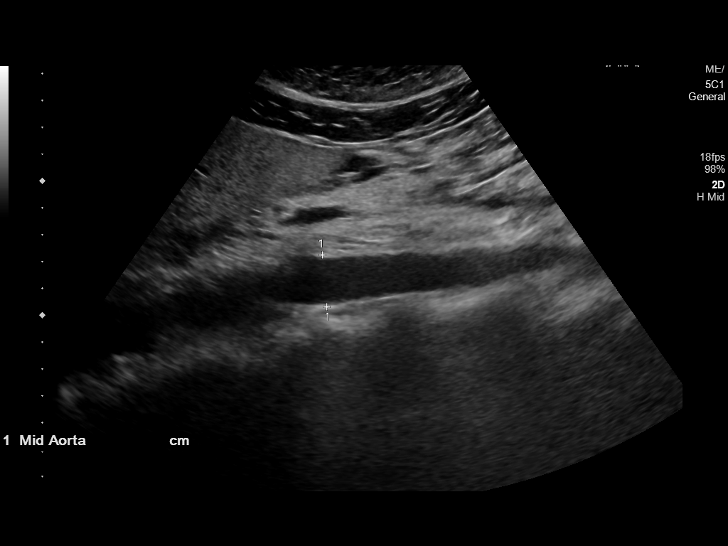
[im 8/23]
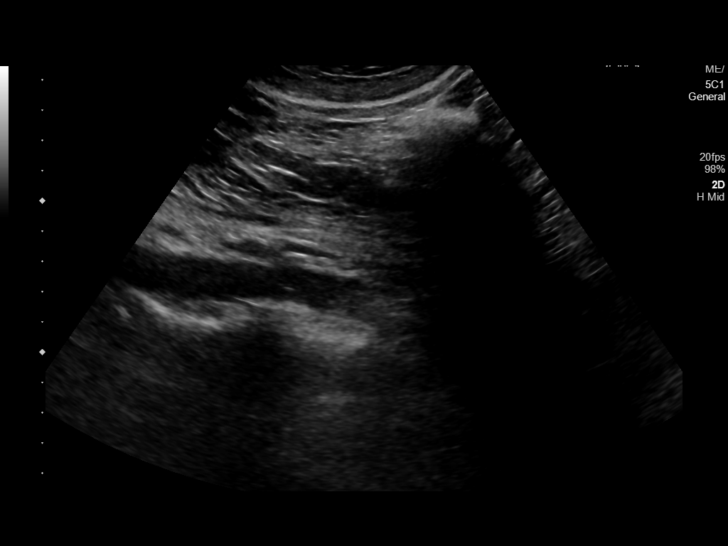
[im 10/23]
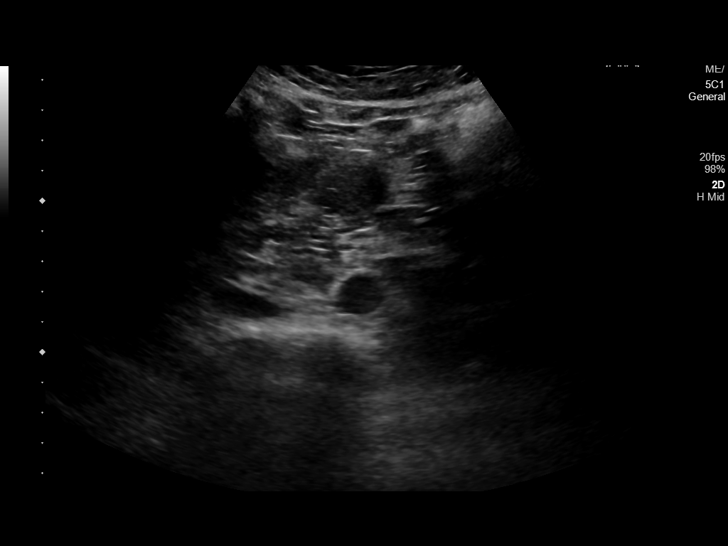
[im 11/23]
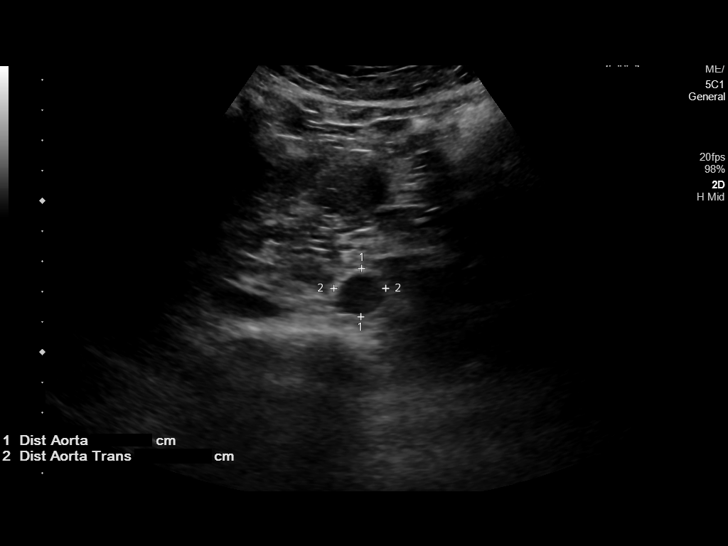
[im 13/23]
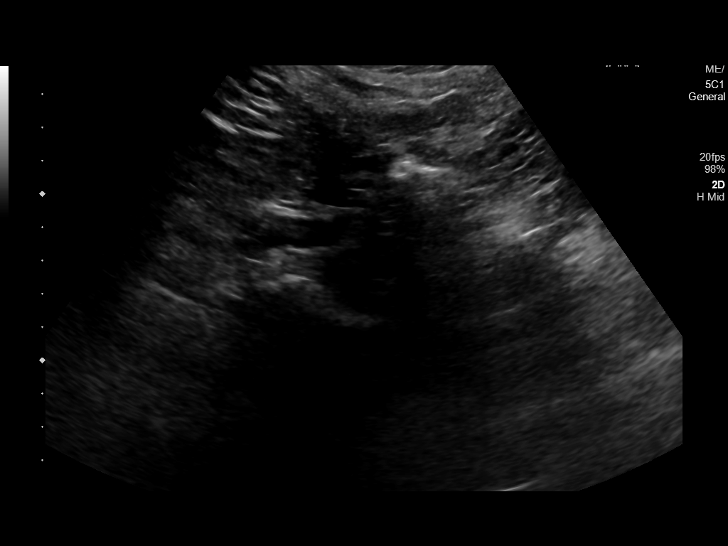
[im 14/23]
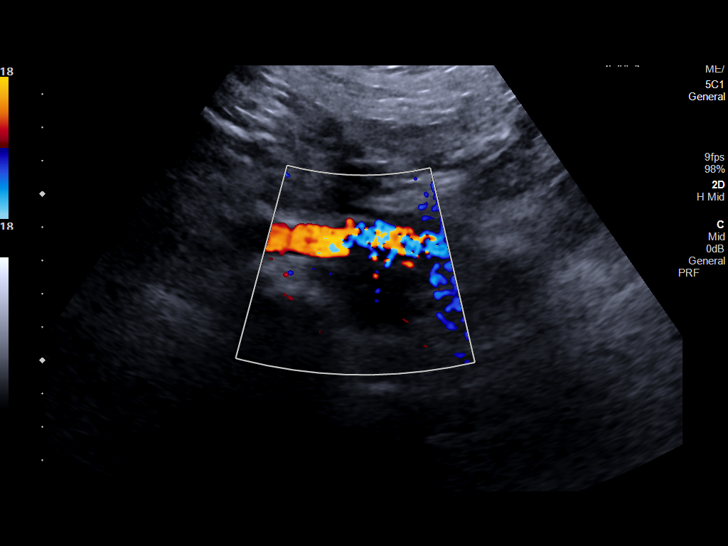
[im 16/23]
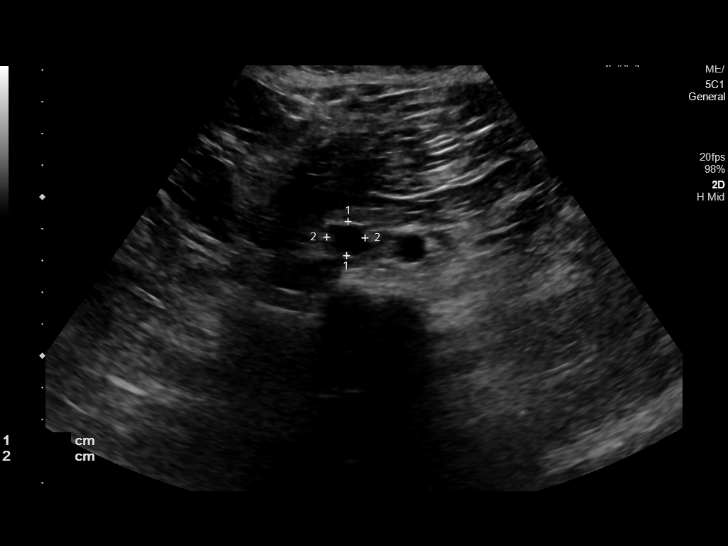
[im 18/23]
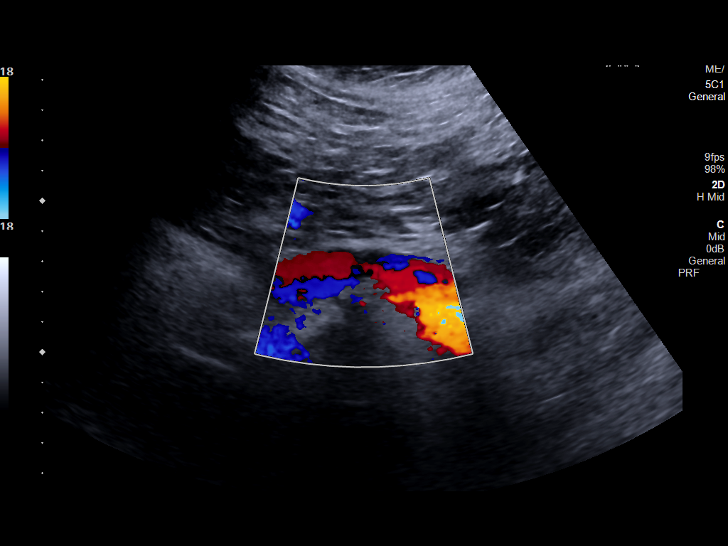
[im 19/23]
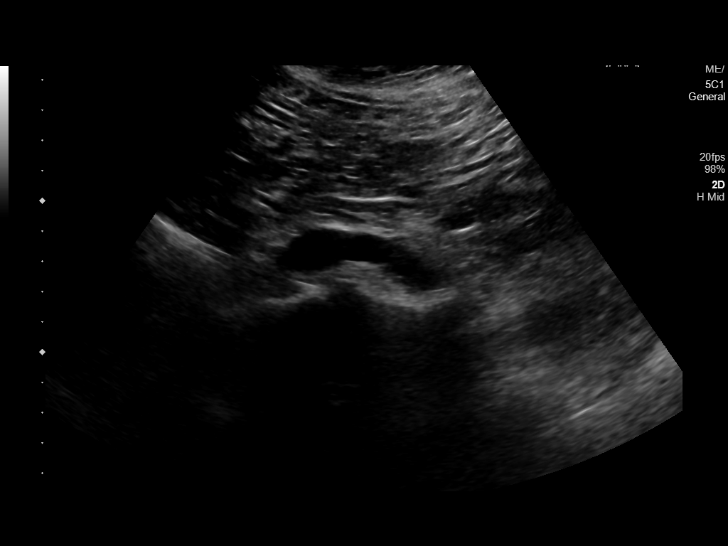
[im 21/23]
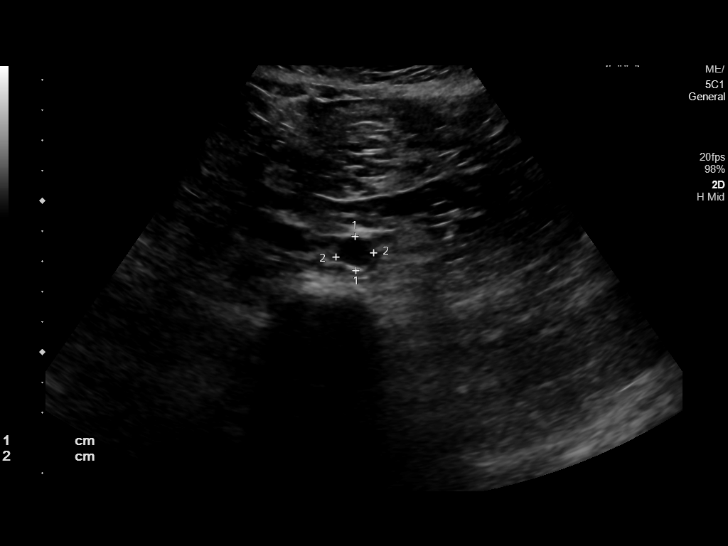
[im 23/23]
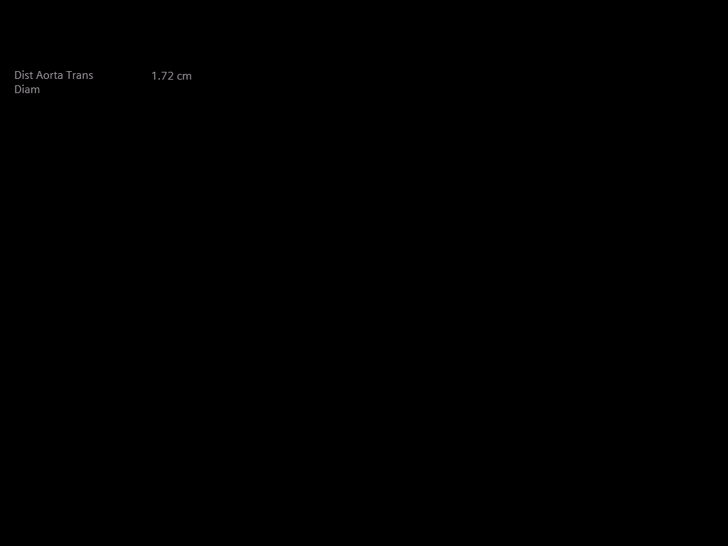

[14 of 23 positions shown; findings below may reference images not displayed]

FINDINGS: Abdominal aortic measurements as follows:

Proximal:  2.8 x 2.4 cm

Mid:  1.7 x 1.7 cm

Distal:  1.7 x 1.6 cm

Right common iliac artery: 1.2 x 1.1 cm

Left common iliac artery: 1.3 x 1.1 cm

Scattered eccentric echogenic plaque is seen throughout the
abdominal aorta.
IMPRESSION: 1. No evidence of abdominal aortic aneurysm.
2.  Aortic Atherosclerosis ([id number]-FVI.I).

## 2024-01-13 ENCOUNTER — Other Ambulatory Visit: Payer: Self-pay | Admitting: Internal Medicine

## 2024-01-13 DIAGNOSIS — E782 Mixed hyperlipidemia: Secondary | ICD-10-CM

## 2024-01-13 DIAGNOSIS — Z0000 Encounter for general adult medical examination without abnormal findings: Secondary | ICD-10-CM

## 2024-01-24 ENCOUNTER — Ambulatory Visit
Admission: RE | Admit: 2024-01-24 | Discharge: 2024-01-24 | Disposition: A | Discharge status: Home / Self Care | Payer: Self-pay | Source: Ambulatory Visit | Attending: Internal Medicine | Admitting: Internal Medicine

## 2024-01-24 DIAGNOSIS — Z0000 Encounter for general adult medical examination without abnormal findings: Secondary | ICD-10-CM | POA: Insufficient documentation

## 2024-01-24 DIAGNOSIS — E782 Mixed hyperlipidemia: Secondary | ICD-10-CM | POA: Insufficient documentation

## 2024-05-11 ENCOUNTER — Ambulatory Visit: Admitting: Anesthesiology

## 2024-05-11 ENCOUNTER — Ambulatory Visit
Admission: RE | Admit: 2024-05-11 | Discharge: 2024-05-11 | Disposition: A | Discharge status: Home Health | Attending: Gastroenterology | Admitting: Gastroenterology

## 2024-05-11 ENCOUNTER — Encounter: Payer: Self-pay | Admitting: Gastroenterology

## 2024-05-11 ENCOUNTER — Encounter
Admission: RE | Disposition: A | Discharge status: Home Health | Payer: Self-pay | Source: Home / Self Care | Attending: Gastroenterology

## 2024-05-11 DIAGNOSIS — K5730 Diverticulosis of large intestine without perforation or abscess without bleeding: Secondary | ICD-10-CM | POA: Insufficient documentation

## 2024-05-11 DIAGNOSIS — E785 Hyperlipidemia, unspecified: Secondary | ICD-10-CM | POA: Insufficient documentation

## 2024-05-11 DIAGNOSIS — D123 Benign neoplasm of transverse colon: Secondary | ICD-10-CM | POA: Diagnosis not present

## 2024-05-11 DIAGNOSIS — K641 Second degree hemorrhoids: Secondary | ICD-10-CM | POA: Insufficient documentation

## 2024-05-11 DIAGNOSIS — Z1211 Encounter for screening for malignant neoplasm of colon: Secondary | ICD-10-CM | POA: Diagnosis present

## 2024-05-11 HISTORY — PX: POLYPECTOMY: SHX149

## 2024-05-11 HISTORY — PX: COLONOSCOPY: SHX5424

## 2024-05-11 SURGERY — COLONOSCOPY
Anesthesia: General

## 2024-05-11 MED ORDER — ONDANSETRON HCL 4 MG/2ML IJ SOLN
INTRAMUSCULAR | Status: DC | PRN
  Administered 2024-05-11: 4 mg via INTRAVENOUS

## 2024-05-11 MED ORDER — DEXMEDETOMIDINE HCL IN NACL 80 MCG/20ML IV SOLN
INTRAVENOUS | Status: DC | PRN
  Administered 2024-05-11: 12 ug via INTRAVENOUS
  Administered 2024-05-11: 8 ug via INTRAVENOUS

## 2024-05-11 MED ORDER — PROPOFOL 500 MG/50ML IV EMUL
INTRAVENOUS | Status: DC | PRN
  Administered 2024-05-11: 75 ug/kg/min via INTRAVENOUS

## 2024-05-11 MED ORDER — SODIUM CHLORIDE 0.9 % IV SOLN
INTRAVENOUS | Status: DC
  Administered 2024-05-11: 20 mL/h via INTRAVENOUS

## 2024-05-11 MED ORDER — LIDOCAINE HCL (PF) 2 % IJ SOLN
INTRAMUSCULAR | Status: AC
  Filled 2024-05-11: qty 5

## 2024-05-11 MED ORDER — EPHEDRINE SULFATE-NACL 50-0.9 MG/10ML-% IV SOSY
PREFILLED_SYRINGE | INTRAVENOUS | Status: DC | PRN
  Administered 2024-05-11: 15 mg via INTRAVENOUS

## 2024-05-11 MED ORDER — LIDOCAINE HCL (CARDIAC) PF 100 MG/5ML IV SOSY
PREFILLED_SYRINGE | INTRAVENOUS | Status: DC | PRN
  Administered 2024-05-11: 60 mg via INTRAVENOUS

## 2024-05-11 MED ORDER — PROPOFOL 10 MG/ML IV BOLUS
INTRAVENOUS | Status: DC | PRN
  Administered 2024-05-11 (×2): 50 mg via INTRAVENOUS

## 2024-05-11 NOTE — Anesthesia Preprocedure Evaluation (Signed)
 Anesthesia Evaluation  Patient identified by MRN, date of birth, ID band Patient awake    Reviewed: Allergy & Precautions, NPO status , Patient's Chart, lab work & pertinent test results  Airway Mallampati: II  TM Distance: >3 FB Neck ROM: full    Dental  (+) Teeth Intact   Pulmonary neg pulmonary ROS   Pulmonary exam normal breath sounds clear to auscultation       Cardiovascular Exercise Tolerance: Good negative cardio ROS Normal cardiovascular exam Rhythm:Regular Rate:Normal     Neuro/Psych negative neurological ROS  negative psych ROS   GI/Hepatic negative GI ROS, Neg liver ROS,,,  Endo/Other  negative endocrine ROS  Class 3 obesity  Renal/GU negative Renal ROS  negative genitourinary   Musculoskeletal   Abdominal  (+) + obese  Peds negative pediatric ROS (+)  Hematology negative hematology ROS (+)   Anesthesia Other Findings Past Medical History: No date: Motion sickness     Comment:  back seat of cars  Past Surgical History: 04 or 05: BREAST BIOPSY; Left     Comment:  neg/stereo with Dr. Sankar. Clip present 10/18/2016: BREAST BIOPSY; Right     Comment:  2 areas of distortion. path pending 10/18/2016: BREAST BIOPSY; Left     Comment:  2 areas of distortion. path pending 08/26/2014: BREAST EXCISIONAL BIOPSY; Right     Comment:  COMPLEX INTRADUCTAL PROLIFERATIVE AND SCLEROTIC               PAPILLARY LESION WITH  yrs ago: BREAST EXCISIONAL BIOPSY; Right     Comment:  benign 01/31/2018: EXCISION MASS NECK; Left     Comment:  Procedure: REMOVAL SUPERFICIAL NECK MASS EPIDERMAL               INCLUSION CYST;  Surgeon: Herminio Miu, MD;                Location: [facility];  Service: ENT;                Laterality: Left;  LOCAL ANESTHESIA  BMI    Body Mass Index: 30.54 kg/m      Reproductive/Obstetrics negative OB ROS                              Anesthesia  Physical Anesthesia Plan  ASA: 2  Anesthesia Plan: General   Post-op Pain Management:    Induction: Intravenous  PONV Risk Score and Plan: Propofol  infusion and TIVA  Airway Management Planned: Natural Airway and Nasal Cannula  Additional Equipment:   Intra-op Plan:   Post-operative Plan: Extubation in OR  Informed Consent: I have reviewed the patients History and Physical, chart, labs and discussed the procedure including the risks, benefits and alternatives for the proposed anesthesia with the patient or authorized representative who has indicated his/her understanding and acceptance.     Dental Advisory Given  Plan Discussed with: CRNA  Anesthesia Plan Comments:         Anesthesia Quick Evaluation

## 2024-05-11 NOTE — Transfer of Care (Signed)
 Immediate Anesthesia Transfer of Care Note  Patient: Sheila [name]  Procedure(s) Performed: COLONOSCOPY POLYPECTOMY, INTESTINE  Patient Location: PACU  Anesthesia Type:General  Level of Consciousness: sedated  Airway & Oxygen Therapy: Patient Spontanous Breathing  Post-op Assessment: Report given to RN and Post -op Vital signs reviewed and stable  Post vital signs: Reviewed and stable  Last Vitals:  Vitals Value Taken Time  BP    Temp    Pulse    Resp    SpO2      Last Pain:  Vitals:   05/11/24 0903  TempSrc: Temporal  PainSc: 0-No pain         Complications: No notable events documented.

## 2024-05-11 NOTE — Op Note (Signed)
 [facility] Patient Name: Sheila [name] Procedure Date: 05/11/2024 9:48 AM MRN: [id number] Account #: [id number] Date of Birth: 16-Jan-1951 Admit Type: Outpatient Age: 73 Room: [facility] ENDO ROOM 3 Gender: Female Note Status: Finalized Instrument Name: Colon Scope [id number] Procedure:             Colonoscopy Indications:           High risk colon cancer surveillance: Personal history                         of colonic polyps, Last colonoscopy 5 years ago Providers:             Ole Schick MD, MD Referring MD:          Oneil PHEBE Pinal, MD (Referring MD) Medicines:             Monitored Anesthesia Care Complications:         No immediate complications. Estimated blood loss:                         Minimal. Procedure:             Pre-Anesthesia Assessment:                        - Prior to the procedure, a History and Physical was                         performed, and patient medications and allergies were                         reviewed. The patient is competent. The risks and                         benefits of the procedure and the sedation options and                         risks were discussed with the patient. All questions                         were answered and informed consent was obtained.                         Patient identification and proposed procedure were                         verified by the physician, the nurse, the                         anesthesiologist, the anesthetist and the technician                         in the endoscopy suite. Mental Status Examination:                         alert and oriented. Airway Examination: normal                         oropharyngeal airway and neck mobility. Respiratory  Examination: clear to auscultation. CV Examination:                         normal. Prophylactic Antibiotics: The patient does not                         require prophylactic antibiotics. Prior                          Anticoagulants: The patient has taken no anticoagulant                         or antiplatelet agents. ASA Grade Assessment: II - A                         patient with mild systemic disease. After reviewing                         the risks and benefits, the patient was deemed in                         satisfactory condition to undergo the procedure. The                         anesthesia plan was to use monitored anesthesia care                         (MAC). Immediately prior to administration of                         medications, the patient was re-assessed for adequacy                         to receive sedatives. The heart rate, respiratory                         rate, oxygen saturations, blood pressure, adequacy of                         pulmonary ventilation, and response to care were                         monitored throughout the procedure. The physical                         status of the patient was re-assessed after the                         procedure.                        After obtaining informed consent, the colonoscope was                         passed under direct vision. Throughout the procedure,                         the patient's blood pressure, pulse, and oxygen  saturations were monitored continuously. The                         Colonoscope was introduced through the anus and                         advanced to the the cecum, identified by appendiceal                         orifice and ileocecal valve. The colonoscopy was                         somewhat difficult due to significant looping.                         Successful completion of the procedure was aided by                         applying abdominal pressure. The patient tolerated the                         procedure well. The quality of the bowel preparation                         was good. The ileocecal valve, appendiceal orifice,                          and rectum were photographed. Findings:      The perianal and digital rectal examinations were normal.      A 7 mm polyp was found in the hepatic flexure. The polyp was sessile.       The polyp was removed with a cold snare. Resection and retrieval were       complete. Estimated blood loss was minimal.      A few small-mouthed diverticula were found in the sigmoid colon.      Internal hemorrhoids were found during retroflexion. The hemorrhoids       were Grade II (internal hemorrhoids that prolapse but reduce       spontaneously).      The exam was otherwise without abnormality on direct and retroflexion       views. Impression:            - One 7 mm polyp at the hepatic flexure, removed with                         a cold snare. Resected and retrieved.                        - Diverticulosis in the sigmoid colon.                        - Internal hemorrhoids.                        - The examination was otherwise normal on direct and                         retroflexion views. Recommendation:        - Discharge patient to home.                        -  Resume previous diet.                        - Continue present medications.                        - Await pathology results.                        - Repeat colonoscopy for surveillance based on                         pathology results.                        - Return to referring physician as previously                         scheduled. Procedure Code(s):     --- Professional ---                        [phone], Colonoscopy, flexible; with removal of                         tumor(s), polyp(s), or other lesion(s) by snare                         technique Diagnosis Code(s):     --- Professional ---                        Z86.010, Personal history of colonic polyps                        D12.3, Benign neoplasm of transverse colon (hepatic                         flexure or splenic flexure)                        K64.1,  Second degree hemorrhoids                        K57.30, Diverticulosis of large intestine without                         perforation or abscess without bleeding CPT copyright 2022 [employer]. All rights reserved. The codes documented in this report are preliminary and upon coder review may  be revised to meet current compliance requirements. Ole Schick MD, MD 05/11/2024 10:22:30 AM Number of Addenda: 0 Note Initiated On: 05/11/2024 9:48 AM Scope Withdrawal Time: 0 hours 8 minutes 44 seconds  Total Procedure Duration: 0 hours 16 minutes 12 seconds  Estimated Blood Loss:  Estimated blood loss was minimal.      [facility]

## 2024-05-11 NOTE — Interval H&P Note (Signed)
 History and Physical Interval Note:  05/11/2024 9:49 AM  Sheila [name]  has presented today for surgery, with the diagnosis of hx OF ADENOMATOUS POLYP OF COLON.  The various methods of treatment have been discussed with the patient and family. After consideration of risks, benefits and other options for treatment, the patient has consented to  Procedure(s): COLONOSCOPY (N/A) as a surgical intervention.  The patient's history has been reviewed, patient examined, no change in status, stable for surgery.  I have reviewed the patient's chart and labs.  Questions were answered to the patient's satisfaction.     Sheila [name]  Ok to proceed with colonoscopy

## 2024-05-11 NOTE — Anesthesia Postprocedure Evaluation (Signed)
 Anesthesia Post Note  Patient: Sheila [name]  Procedure(s) Performed: COLONOSCOPY POLYPECTOMY, INTESTINE  Patient location during evaluation: PACU Anesthesia Type: General Level of consciousness: awake and awake and alert Pain management: satisfactory to patient Vital Signs Assessment: post-procedure vital signs reviewed and stable Respiratory status: spontaneous breathing Cardiovascular status: stable Anesthetic complications: no   No notable events documented.   Last Vitals:  Vitals:   05/11/24 0903 05/11/24 1020  BP: (!) 156/106 109/70  Pulse: 89 96  Resp: 20 20  Temp: (!) 35.6 C (!) 36.3 C  SpO2: 99% 97%    Last Pain:  Vitals:   05/11/24 1020  TempSrc: Temporal  PainSc: Asleep                 VAN STAVEREN,Lismary Kiehn

## 2024-05-11 NOTE — H&P (Signed)
 Outpatient short stay form Pre-procedure 05/11/2024  Ole ONEIDA Schick, MD  Primary Physician: Cleotilde Oneil FALCON, MD  Reason for visit:  Surveillance  History of present illness:   73 y/o lady with history of HLD here for surveillance colonoscopy. Last colonoscopy in 2020 was normal but has history of polyps. No blood thinners. No family history of GI malignancies. No significant abdominal surgeries.    Current Facility-Administered Medications:    0.9 %  sodium chloride  infusion, , Intravenous, Continuous, Zae Kirtz, Ole ONEIDA, MD, Last Rate: 20 mL/hr at 05/11/24 9070, Continued from Pre-op at 05/11/24 9070  Medications Prior to Admission  Medication Sig Dispense Refill Last Dose/Taking   Multiple Vitamin (MULTIVITAMIN) tablet Take 1 tablet by mouth daily.   Past Week     No Known Allergies   Past Medical History:  Diagnosis Date   Motion sickness    back seat of cars    Review of systems:  Otherwise negative.    Physical Exam  Gen: Alert, oriented. Appears stated age.  HEENT: PERRLA. Lungs: No respiratory distress CV: RRR Abd: soft, benign, no masses Ext: No edema    Planned procedures: Proceed with colonoscopy. The patient understands the nature of the planned procedure, indications, risks, alternatives and potential complications including but not limited to bleeding, infection, perforation, damage to internal organs and possible oversedation/side effects from anesthesia. The patient agrees and gives consent to proceed.  Please refer to procedure notes for findings, recommendations and patient disposition/instructions.     Ole ONEIDA Schick, MD [facility]

## 2024-05-12 LAB — SURGICAL PATHOLOGY
